# Patient Record
Sex: Male | Born: 1980 | Marital: Married | Smoking: Never smoker
Health system: Southern US, Community
[De-identification: ages and names within clinical notes are randomized; demographics above are authoritative.]

---

## 2018-04-24 ENCOUNTER — Other Ambulatory Visit: Payer: Self-pay | Admitting: Otolaryngology

## 2018-04-24 DIAGNOSIS — H903 Sensorineural hearing loss, bilateral: Secondary | ICD-10-CM

## 2018-04-24 DIAGNOSIS — H9312 Tinnitus, left ear: Secondary | ICD-10-CM

## 2018-05-08 ENCOUNTER — Ambulatory Visit
Admission: RE | Admit: 2018-05-08 | Discharge: 2018-05-08 | Disposition: A | Discharge status: Home / Self Care | Payer: 59 | Source: Ambulatory Visit | Attending: Otolaryngology | Admitting: Otolaryngology

## 2018-05-08 DIAGNOSIS — H903 Sensorineural hearing loss, bilateral: Secondary | ICD-10-CM

## 2018-05-08 DIAGNOSIS — H9312 Tinnitus, left ear: Secondary | ICD-10-CM

## 2018-05-08 MED ORDER — GADOBENATE DIMEGLUMINE 529 MG/ML IV SOLN
17.0000 mL | Freq: Once | INTRAVENOUS | Status: AC | PRN
  Administered 2018-05-08: 17 mL via INTRAVENOUS

## 2019-03-13 ENCOUNTER — Emergency Department (HOSPITAL_COMMUNITY)
Admission: EM | Admit: 2019-03-13 | Discharge: 2019-03-13 | Disposition: A | Discharge status: Home / Self Care | Payer: 59 | Attending: Emergency Medicine | Admitting: Emergency Medicine

## 2019-03-13 ENCOUNTER — Encounter (HOSPITAL_COMMUNITY): Payer: Self-pay | Admitting: Emergency Medicine

## 2019-03-13 ENCOUNTER — Other Ambulatory Visit: Payer: Self-pay

## 2019-03-13 DIAGNOSIS — F322 Major depressive disorder, single episode, severe without psychotic features: Secondary | ICD-10-CM | POA: Diagnosis not present

## 2019-03-13 DIAGNOSIS — Z20828 Contact with and (suspected) exposure to other viral communicable diseases: Secondary | ICD-10-CM | POA: Diagnosis not present

## 2019-03-13 DIAGNOSIS — F4325 Adjustment disorder with mixed disturbance of emotions and conduct: Secondary | ICD-10-CM | POA: Insufficient documentation

## 2019-03-13 DIAGNOSIS — R4689 Other symptoms and signs involving appearance and behavior: Secondary | ICD-10-CM | POA: Diagnosis present

## 2019-03-13 LAB — COMPREHENSIVE METABOLIC PANEL
ALT: 17 U/L (ref 0–44)
AST: 19 U/L (ref 15–41)
Albumin: 4.3 g/dL (ref 3.5–5.0)
Alkaline Phosphatase: 44 U/L (ref 38–126)
Anion gap: 11 (ref 5–15)
BUN: 7 mg/dL (ref 6–20)
CO2: 24 mmol/L (ref 22–32)
Calcium: 8.8 mg/dL — ABNORMAL LOW (ref 8.9–10.3)
Chloride: 106 mmol/L (ref 98–111)
Creatinine, Ser: 0.9 mg/dL (ref 0.61–1.24)
GFR calc Af Amer: >60 mL/min (ref >60)
GFR calc non Af Amer: >60 mL/min (ref >60)
Glucose, Bld: 90 mg/dL (ref 70–99)
Potassium: 4.6 mmol/L (ref 3.5–5.1)
Sodium: 141 mmol/L (ref 135–145)
Total Bilirubin: 0.5 mg/dL (ref 0.3–1.2)
Total Protein: 7.1 g/dL (ref 6.5–8.1)

## 2019-03-13 LAB — CBC WITH DIFFERENTIAL/PLATELET
Abs Immature Granulocytes: 0.02 10*3/uL (ref 0.00–0.07)
Basophils Absolute: 0.1 10*3/uL (ref 0.0–0.1)
Basophils Relative: 1 %
Eosinophils Absolute: 0.3 10*3/uL (ref 0.0–0.5)
Eosinophils Relative: 6 %
HCT: 45.5 % (ref 39.0–52.0)
Hemoglobin: 14.6 g/dL (ref 13.0–17.0)
Immature Granulocytes: 0 %
Lymphocytes Relative: 33 %
Lymphs Abs: 1.7 10*3/uL (ref 0.7–4.0)
MCH: 30.1 pg (ref 26.0–34.0)
MCHC: 32.1 g/dL (ref 30.0–36.0)
MCV: 93.8 fL (ref 80.0–100.0)
Monocytes Absolute: 0.4 10*3/uL (ref 0.1–1.0)
Monocytes Relative: 8 %
Neutro Abs: 2.6 10*3/uL (ref 1.7–7.7)
Neutrophils Relative %: 52 %
Platelets: 338 10*3/uL (ref 150–400)
RBC: 4.85 MIL/uL (ref 4.22–5.81)
RDW: 13.6 % (ref 11.5–15.5)
WBC: 5 10*3/uL (ref 4.0–10.5)
nRBC: 0 % (ref 0.0–0.2)

## 2019-03-13 LAB — RAPID URINE DRUG SCREEN, HOSP PERFORMED
Amphetamines: NOT DETECTED
Barbiturates: NOT DETECTED
Benzodiazepines: NOT DETECTED
Cocaine: NOT DETECTED
Opiates: NOT DETECTED
Tetrahydrocannabinol: NOT DETECTED

## 2019-03-13 LAB — SALICYLATE LEVEL: Salicylate Lvl: <7 mg/dL (ref 2.8–30.0)

## 2019-03-13 LAB — SARS CORONAVIRUS 2 (TAT 6-24 HRS): SARS Coronavirus 2: NEGATIVE

## 2019-03-13 LAB — ACETAMINOPHEN LEVEL: Acetaminophen (Tylenol), Serum: <10 ug/mL — ABNORMAL LOW (ref 10–30)

## 2019-03-13 LAB — ETHANOL: Alcohol, Ethyl (B): 100 mg/dL — ABNORMAL HIGH (ref <10)
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
## 2019-03-13 NOTE — ED Notes (Addendum)
Per Claiborne Billings, [place], ok to hold off on lab work until Pt speaks with assessment counselor

## 2019-03-13 NOTE — BH Assessment (Addendum)
[place] Assessment Progress Note  Per Neita Garnet, MD, this voluntary pt does not require psychiatric hospitalization at this time.  Pt is to be discharged from [facility], but with encouragement to enroll in the Partial Hospitalization Program at the [facility] at [place].  However, pt was discharged before I could speak to him.  At 14:45 I called pt at the number listed in his registration record, leaving a HIPAA compliant message requesting a return call.  As of this writing, return call is pending.  Please note that pt reports having an outpatient provider currently.  Jalene Mullet, [place] Triage Specialist [phone]

## 2019-03-13 NOTE — ED Notes (Signed)
Pt's father Keigo Whalley is in his car request to be called when pt is discharged [phone]

## 2019-03-13 NOTE — ED Provider Notes (Signed)
[facility]-EMERGENCY DEPT Provider Note   CSN: [id number] Arrival date & time: 03/13/19  0213     History   Chief Complaint Chief Complaint  Patient presents with  . Medical Clearance    HPI Brandon [name] is a 38 y.o. male.     38 year old male presents to the emergency department with [facility] officer after wife called police due to concern over an incident in the home. Patient reports that he learned of an affair between his wife and coworker at the beginning of October. They have been doing intensive counseling with her pastor as well as a therapist; went to a marriage counseling seminar as well. He was trying to discuss this with his wife tonight, but felt like she "shut down". States that he felt the need to do something to "get her attention". Reports grabbing a firearm, which was loaded, and waving it around. Denies ever putting the gun to his head or having suicidal intent. Further denies homicidal ideations. Did eventually put the weapon away and went to bed. States that he was awoken from sleep to be brought to the hospital. Patient denies history of prior psychiatric illness, behavioral health hospitalization, suicide attempt. He does endorse drinking alcohol tonight. No illicit drug use.  The history is provided by the patient. No language interpreter was used.    History reviewed. No pertinent past medical history.  There are no active problems to display for this patient.   History reviewed. No pertinent surgical history.      Home Medications    Prior to Admission medications   Medication Sig Start Date End Date Taking? Authorizing Provider  Ibuprofen-diphenhydrAMINE HCl (IBUPROFEN PM) 200-25 MG CAPS Take 2 tablets by mouth at bedtime as needed (sleep).   Yes [provider]    Family History History reviewed. No pertinent family history.  Social History Social History   Tobacco Use  . Smoking status: Never  Smoker  . Smokeless tobacco: Never Used  Substance Use Topics  . Alcohol use: Yes  . Drug use: Never     Allergies   Codeine and Sulfa antibiotics   Review of Systems Review of Systems Ten systems reviewed and are negative for acute change, except as noted in the HPI.    Physical Exam Updated Vital Signs BP (!) 155/93 (BP Location: Right Arm)   Pulse 82   Temp 97.9 F (36.6 C) (Oral)   Resp 18   Ht 6' (1.829 m)   Wt 77.1 kg   SpO2 99%   BMI 23.06 kg/m   Physical Exam Vitals signs and nursing note reviewed.  Constitutional:      General: He is not in acute distress.    Appearance: He is well-developed. He is not diaphoretic.     Comments: Nontoxic appearing. Calm and cooperative. Good eye contact with conversation.  HENT:     Head: Normocephalic and atraumatic.  Eyes:     General: No scleral icterus.    Conjunctiva/sclera: Conjunctivae normal.  Neck:     Musculoskeletal: Normal range of motion.  Pulmonary:     Effort: Pulmonary effort is normal. No respiratory distress.     Comments: Respirations even and unlabored Musculoskeletal: Normal range of motion.  Skin:    General: Skin is warm and dry.     Coloration: Skin is not pale.     Findings: No erythema or rash.  Neurological:     Mental Status: He is alert and oriented to person,  place, and time.  Psychiatric:        Behavior: Behavior normal.     Comments: Denies SI/HI. No AVH.      ED Treatments / Results  Labs (all labs ordered are listed, but only abnormal results are displayed) Labs Reviewed  CBC WITH DIFFERENTIAL/PLATELET  COMPREHENSIVE METABOLIC PANEL  ETHANOL  RAPID URINE DRUG SCREEN, HOSP PERFORMED  ACETAMINOPHEN LEVEL  SALICYLATE LEVEL    EKG None  Radiology No results found.  Procedures Procedures (including critical care time)  Medications Ordered in ED Medications - No data to display   Initial Impression / Assessment and Plan / ED Course  I have reviewed the triage  vital signs and the nursing notes.  Pertinent labs & imaging results that were available during my care of the patient were reviewed by me and considered in my medical decision making (see chart for details).        38 year old male presents to the emergency department for psychiatric evaluation.  [facility] called by patient's wife after he was waving a gun around at home.  States that he only did this to try and get his wife's attention.  Denies any suicidal or homicidal thoughts or intention.  No history of psychiatric hospitalization or suicide attempt.  Pending TTS evaluation.  He is currently in the ED voluntarily.  Would advise IVC if patient attempts to leave the department prior to psychiatric assessment.  Disposition to be determined by oncoming ED provider.   Final Clinical Impressions(s) / ED Diagnoses   Final diagnoses:  Adjustment disorder with mixed disturbance of emotions and conduct    ED Discharge Orders    None       Antonietta Breach, PA-C 03/13/19 0603    Orpah Greek, MD 03/13/19 [phone]
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
## 2020-02-24 IMAGING — MR MR HEAD WO/W CM
12 series · 38 of 48 positions shown · IV contrast (multihance)
Comparison: None.

CLINICAL DATA: Hearing loss and tinnitus on the left over the last
year.

EXAM:
MRI HEAD WITHOUT AND WITH CONTRAST
TECHNIQUE: Multiplanar, multiecho pulse sequences of the brain and surrounding
structures were obtained without and with intravenous contrast.
CONTRAST:  17mL MULTIHANCE GADOBENATE DIMEGLUMINE 529 MG/ML IV SOLN

[Series 2: T1 · sagittal · 5.0mm · 0.45mm/px · 2 of 26 slices shown (1 of 4)]
[im 1/26]
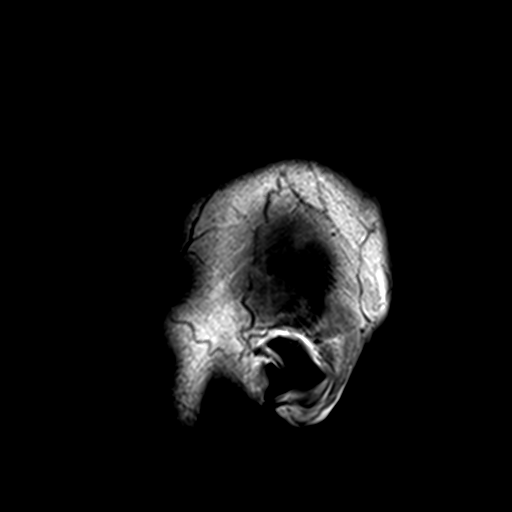
[im 26/26]
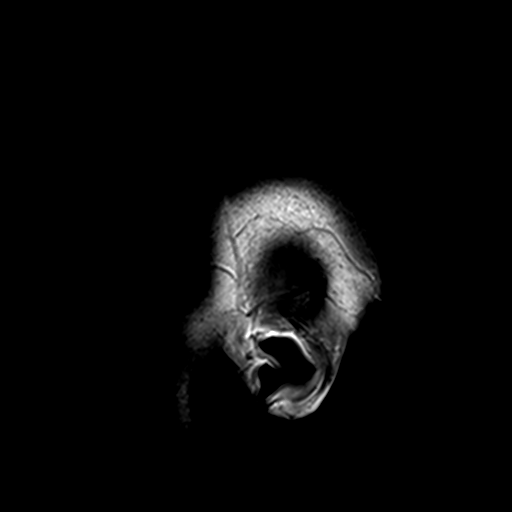

[Series 3: ep2d_diff_(id)_trace · axial · 3.0mm · 1.80mm/px · z∈[-45,+114]mm · 8 of 103 slices shown]
[im 1/103]
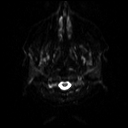
[im 12/103]
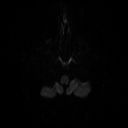
[im 35/103]
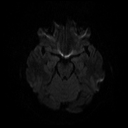
[im 46/103]
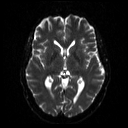
[im 57/103]
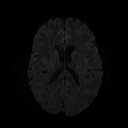
[im 69/103]
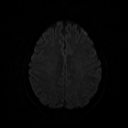
[im 91/103]
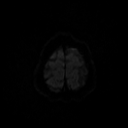
[im 103/103]
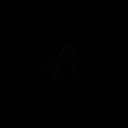

[Series 4: ep2d_diff_(id)_trace_adc · axial · 3.0mm · 1.80mm/px · z∈[-45,+114]mm · 5 of 53 slices shown]
[im 1/53]
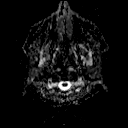
[im 14/53]
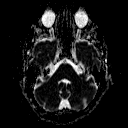
[im 27/53]
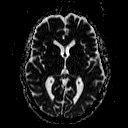
[im 40/53]
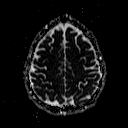
[im 53/53]
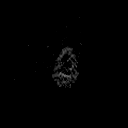

[Series 5: t2_tse_tra_512 · axial · 5.0mm · 0.62mm/px · z∈[-50,+112]mm · 3 of 27 slices shown]
[im 1/27]
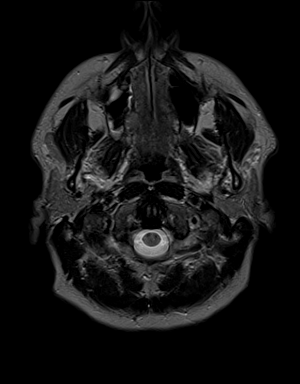
[im 14/27]
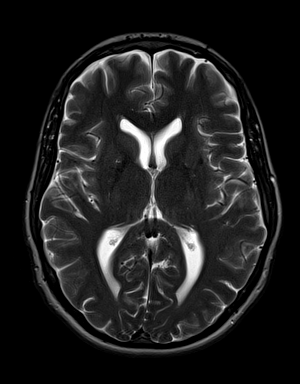
[im 27/27]
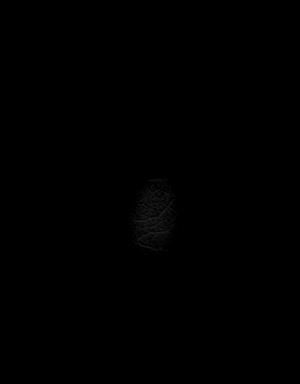

[Series 7: swi_images · axial · 2.0mm · 0.94mm/px · z∈[-56,+118]mm · 8 of 88 slices shown]
[im 1/88]
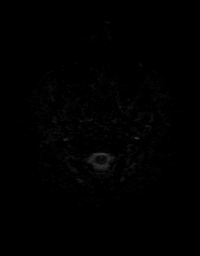
[im 11/88]
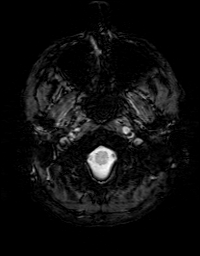
[im 22/88]
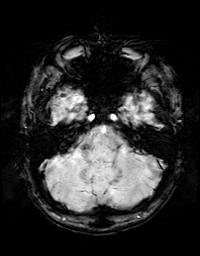
[im 33/88]
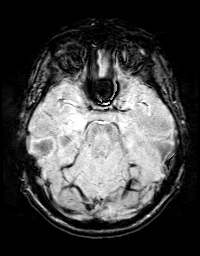
[im 55/88]
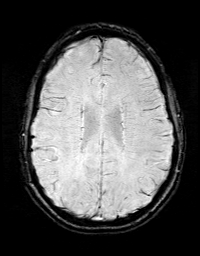
[im 66/88]
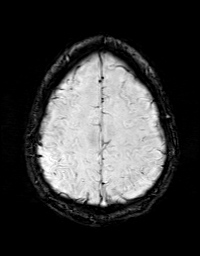
[im 77/88]
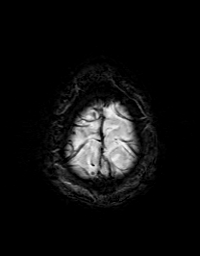
[im 88/88]
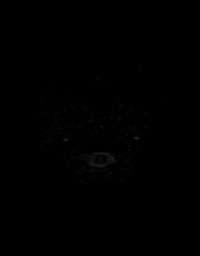

[Series 8: FLAIR · axial · 3.0mm · 0.43mm/px · z∈[-48,+108]mm · 3 of 27 slices shown]
[im 1/27]
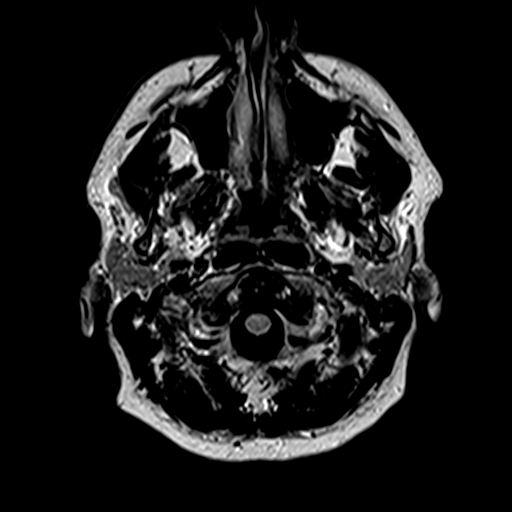
[im 14/27]
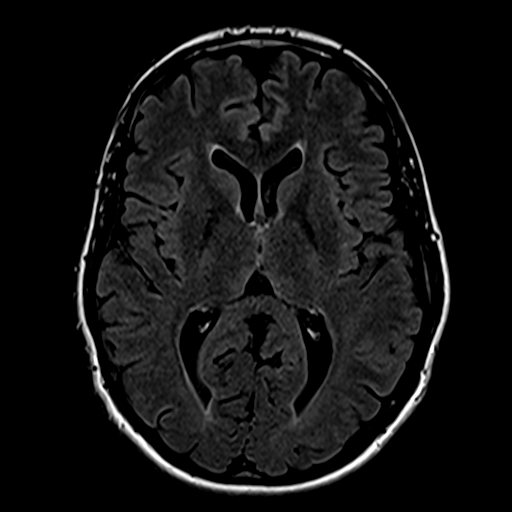
[im 27/27]
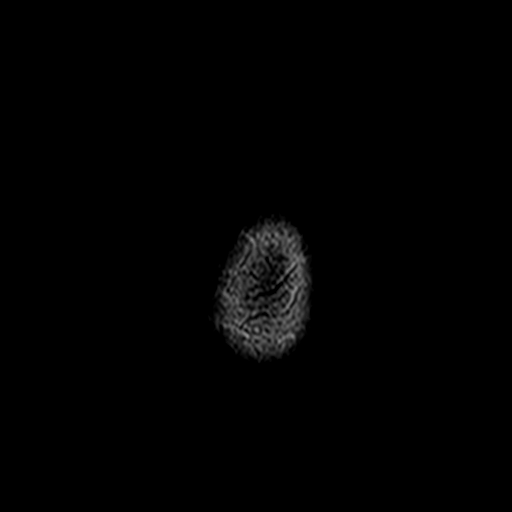

[Series 9: T1 · coronal · 3.0mm · 0.35mm/px · 1 of 11 slices shown (2 of 4)]
[im 1/11]
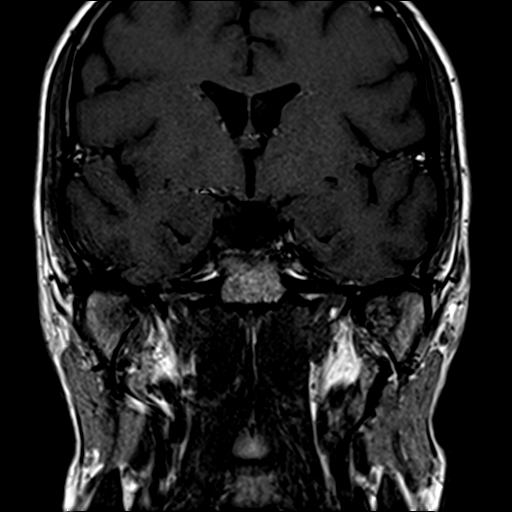

[Series 10: T1 · axial · 3.0mm · 0.35mm/px · 1 of 11 slices shown (3 of 4)]
[im 1/11]
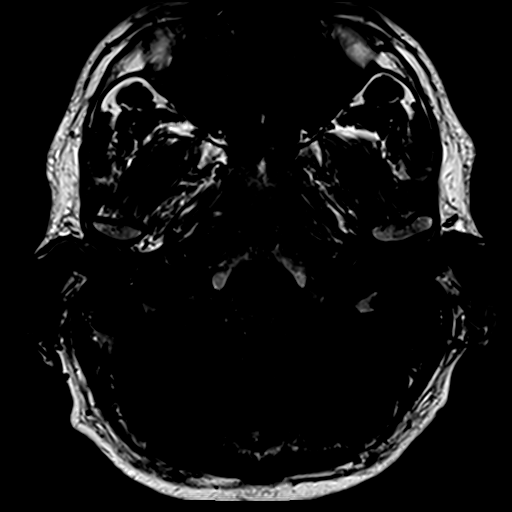

[Series 11: bSSFP · axial · 0.7mm · 0.30mm/px · z∈[-29,+1]mm · 4 of 44 slices shown]
[im 1/44]
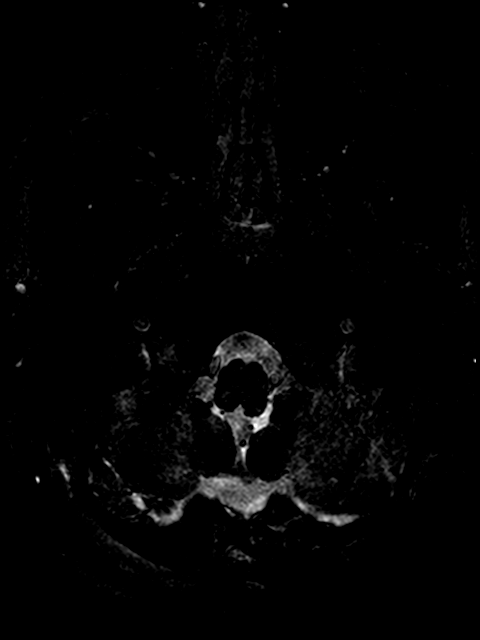
[im 15/44]
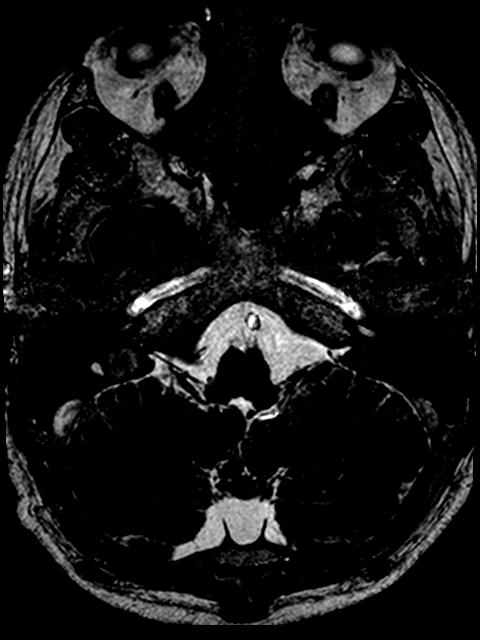
[im 29/44]
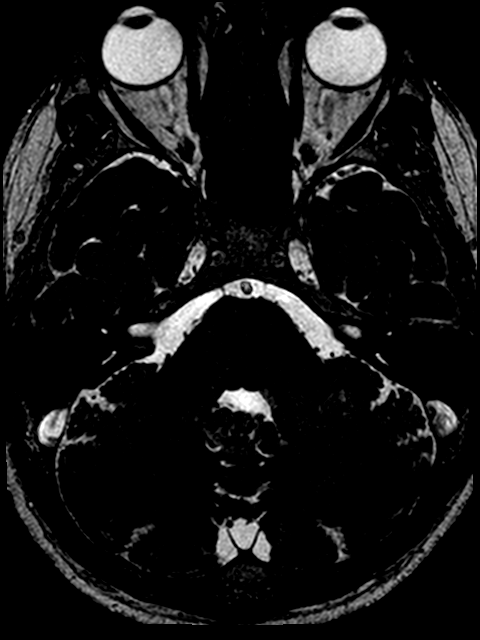
[im 44/44]
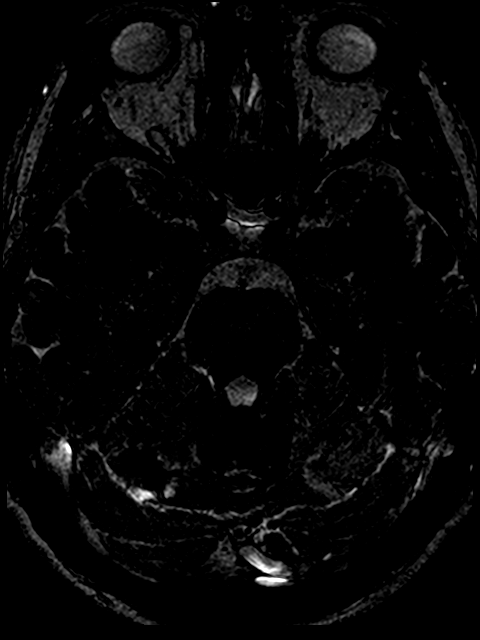

[Series 12: T1 · coronal · 3.0mm · 0.35mm/px · 1 of 11 slices shown (4 of 4)]
[im 1/11]
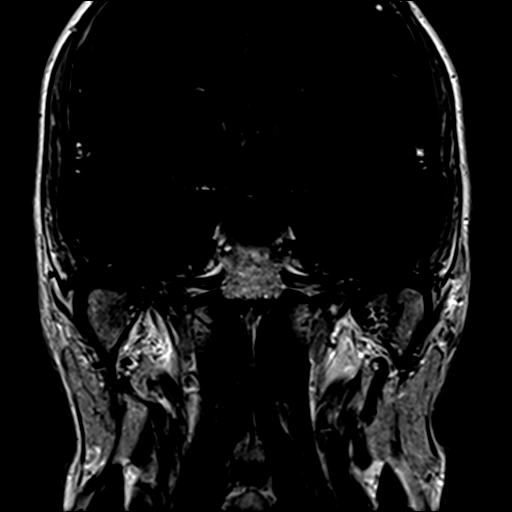

[Series 13: T1 post-contrast · axial · 3.0mm · 0.35mm/px · 1 of 11 slices shown]
[im 1/11]
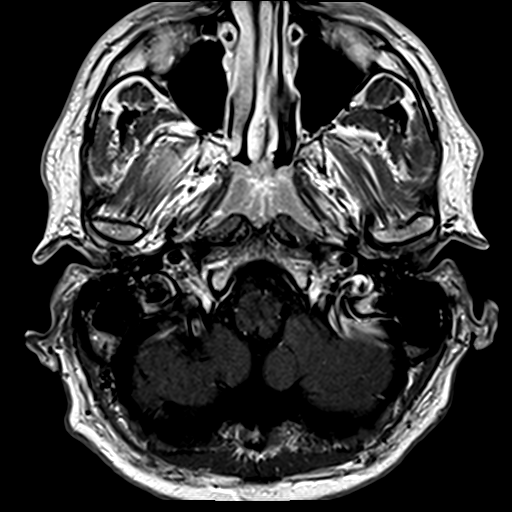

[Series 14: axial (person_name) · axial · 2.0mm · 0.47mm/px · 1 of 80 slices shown]
[im 1/80]
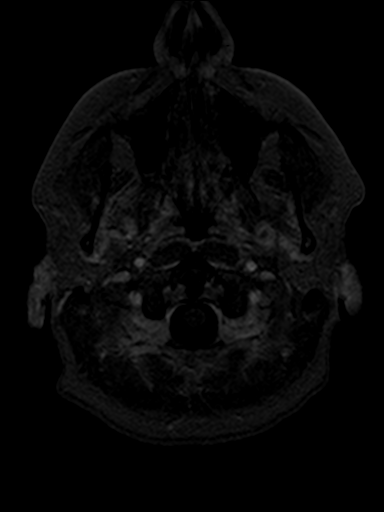

[38 of 48 positions shown; findings below may reference images not displayed]

FINDINGS: Brain: The brain has a normal appearance without evidence of
malformation, atrophy, old or acute small or large vessel
infarction, mass lesion, hemorrhage, hydrocephalus or extra-axial
collection. CP angle regions appear normal. No vestibular
schwannoma. No enhancing neuritis. Inner ear structures appear
normal by MR. No abnormal contrast enhancement occurs elsewhere.

Vascular: Major vessels at the base of the brain show flow. Venous
sinuses appear patent.

Skull and upper cervical spine: Normal.

Sinuses/Orbits: Clear/normal. No fluid in the middle ears or
mastoids.

Other: None significant.
IMPRESSION: Normal examination. No abnormality seen to explain left-sided
hearing loss and tinnitus.

## 2021-01-13 NOTE — Progress Notes (Signed)
 Patient ID:  Brandon [name] is a 40 y.o. (DOB 16-Nov-1980) male. BP 130/78 (BP Location: Left arm, Patient Position: Sitting)   Pulse 72   Ht 5' 11.5 (1.816 m)   Wt 206 lb (93.4 kg)   SpO2 98%   BMI 28.33 kg/m  Subjective  PROBLEM LIST: (Stable = s) Christopher There are no problems to display for this patient.   Past Medical History, Past Surgery History, Allergies, Social History, and Family History were reviewed and updated.  Chief Complaint  Patient presents with  . 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   Objective   BP 130/78 (BP Location: Left arm, Patient Position: Sitting)   Pulse 72   Ht 5' 11.5 (1.816 m)   Wt 206 lb (93.4 kg)   SpO2 98%   BMI 28.33 kg/m  General:  Well Developed, Well Nourished, No distress Head, Eyes, Ears, Nose, Throat: Normocephalic, atraumatic, PERRLA Neck:  Supple with normal range of motion, No lymphadenopathy. Grade 0/6 right carotid; grade 0/6 left carotid. Cardiovascular:  S1S2, regular, rate, and rhythm without gallops or rubs. No murmurs Lungs:  Clear to auscultation with normal effort. Normal air movement Abdomen:  Soft, non-tender, non-distended, no hepatosplenomegaly or masses Skin:  No Focal Rashes Extremities:  No clubbing, cyanosis, or edema. No calf tenderness. Neurologic:  No focal deficits, Cranial Nerves II-XII intact. Good strength in all extremities, sensation intact.  Alert and oriented X 3. Motor, sensory, and cranial nerves are normal. Cerebral and cerebellar intact.  Mood and affect is normal.  Assessment   1. Encounter for [employer]) examination    for [employer]     Patient's Medications    as of January 13, 2021 11:32 AM   You have not been prescribed any medications.      Risks, benefits, and alternatives of the medications and treatment plan prescribed today were discussed, and patient expressed understanding.  Discussed diet and exercise.  Discussed weight loss as an option to improve health outcomes.  Follow-up at next scheduled appointment or prn if no better or if symptoms worsen.     No orders of the defined types were placed in this encounter.

## 2024-02-03 ENCOUNTER — Ambulatory Visit: Admission: EM | Admit: 2024-02-03 | Discharge: 2024-02-03 | Disposition: A | Discharge status: Home / Self Care

## 2024-02-03 ENCOUNTER — Ambulatory Visit

## 2024-02-03 DIAGNOSIS — Z0000 Encounter for general adult medical examination without abnormal findings: Secondary | ICD-10-CM | POA: Diagnosis not present

## 2024-02-03 NOTE — ED Provider Notes (Signed)
 [facility]    CSN: [id number] Arrival date & time: 02/03/24  1157      History   Chief Complaint Chief Complaint  Patient presents with   Blood Pressure Problem    HPI Brandon [name] is a 43 y.o. male.   Pt is requesting check of his blood pressure.  Pt reports he has been checking at home and has had some elevated readings.  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 Pt denies any histroy of high blood pressure. No chest pain  no shortness of breath.  No medical problems. No allergies   The history is provided by the patient. No language interpreter was used.    History reviewed. No pertinent past medical history.  There are no active problems to display for this patient.   History reviewed. No pertinent surgical history.     Home Medications    Prior to Admission medications   Medication Sig Start Date End Date Taking? Authorizing Provider  Ibuprofen-diphenhydrAMINE HCl (IBUPROFEN PM) 200-25 MG CAPS Take 2 tablets by mouth at bedtime as needed (sleep).    [provider]    Family History Family History  Problem Relation Age of Onset   Hypertension Mother    Hypertension Father     Social History Social History   Tobacco Use   Smoking status: Never    Passive exposure: Never   Smokeless tobacco: Never  Vaping Use   Vaping status: Never Used  Substance Use Topics   Alcohol use: Yes    Comment: 4-6 beers daily   Drug use: Never     Allergies   Codeine and Sulfa antibiotics   Review of Systems Review of Systems  All other systems reviewed and are negative.    Physical Exam Triage Vital Signs ED Triage Vitals  Encounter Vitals Group     BP 02/03/24 1211 119/75     Girls Systolic BP Percentile --      Girls Diastolic BP Percentile --      Boys Systolic BP Percentile --      Boys Diastolic BP Percentile --      Pulse Rate 02/03/24 1211 87     Resp 02/03/24 1211 18     Temp 02/03/24 1211 98.2  F (36.8 C)     Temp Source 02/03/24 1211 Oral     SpO2 02/03/24 1211 97 %     Weight 02/03/24 1209 200 lb (90.7 kg)     Height 02/03/24 1209 6' (1.829 m)     Head Circumference --      Peak Flow --      Pain Score 02/03/24 1209 0     Pain Loc --      Pain Education --      Exclude from Growth Chart --    No data found.  Updated Vital Signs BP 119/82 (BP Location: Left Arm)   Pulse 87   Temp 98.2 F (36.8 C) (Oral)   Resp 18   Ht 6' (1.829 m)   Wt 90.7 kg   SpO2 97%   BMI 27.12 kg/m   Visual Acuity Right Eye Distance:   Left Eye Distance:   Bilateral Distance:    Right Eye Near:   Left Eye Near:    Bilateral Near:     Physical Exam Vitals and nursing note reviewed.  Constitutional:      Appearance: He is well-developed.  HENT:  Head: Normocephalic.  Cardiovascular:     Rate and Rhythm: Normal rate.  Pulmonary:     Effort: Pulmonary effort is normal.  Abdominal:     General: There is no distension.  Musculoskeletal:        General: Normal range of motion.  Skin:    General: Skin is warm.  Neurological:     General: No focal deficit present.     Mental Status: He is alert and oriented to person, place, and time.      UC Treatments / Results  Labs (all labs ordered are listed, but only abnormal results are displayed) Labs Reviewed - No data to display  EKG   Radiology No results found.  Procedures Procedures (including critical care time)  Medications Ordered in UC Medications - No data to display  Initial Impression / Assessment and Plan / UC Course  I have reviewed the triage vital signs and the nursing notes.  Pertinent labs & imaging results that were available during my care of the patient were reviewed by me and considered in my medical decision making (see chart for details).     Patient's blood pressure is normal on his initial vital signs.  Recheck blood pressure is normal.  Patient is advised on healthy dieting to minimize  high blood pressure risk.  Patient is advised to follow-up with his primary care physician for recheck return if any problems Final Clinical Impressions(s) / UC Diagnoses   Final diagnoses:  Normal exam     Discharge Instructions      Return if any problems.   ED Prescriptions   None    PDMP not reviewed this encounter. An After Visit Summary was printed and given to the patient.       Flint Sonny POUR, PA-C 02/03/24 1416

## 2024-02-03 NOTE — Discharge Instructions (Signed)
 Return if any problems.

## 2024-02-03 NOTE — ED Triage Notes (Addendum)
 Patient requires initiation of blood pressure medication. During a recent physical examination (08-02-2023), the patient's blood pressure was found to be elevated. He has monitored the blood pressure since then, with readings ranging from the 130s/90-98, although the exact values are unclear. No formal diagnosis of hypertension has been made at this time. The patient has an upcoming visit with a new primary care provider (PCP). 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 The patient requests to start the medication here and will continue with their PCP.
